# Patient Record
Sex: Female | Born: 1970 | Race: White | Hispanic: No | Marital: Married
Health system: Southern US, Community
[De-identification: ages and names within clinical notes are randomized; demographics above are authoritative.]

---

## 2014-02-12 ENCOUNTER — Other Ambulatory Visit (HOSPITAL_BASED_OUTPATIENT_CLINIC_OR_DEPARTMENT_OTHER): Payer: Self-pay | Admitting: Family Medicine

## 2014-02-12 ENCOUNTER — Other Ambulatory Visit: Payer: Self-pay | Admitting: Family Medicine

## 2014-02-12 ENCOUNTER — Other Ambulatory Visit (HOSPITAL_COMMUNITY)
Admission: RE | Admit: 2014-02-12 | Discharge: 2014-02-12 | Disposition: A | Discharge status: Home / Self Care | Payer: BC Managed Care – PPO | Source: Ambulatory Visit | Attending: Family Medicine | Admitting: Family Medicine

## 2014-02-12 DIAGNOSIS — Z0000 Encounter for general adult medical examination without abnormal findings: Secondary | ICD-10-CM | POA: Insufficient documentation

## 2014-02-12 DIAGNOSIS — Z1151 Encounter for screening for human papillomavirus (HPV): Secondary | ICD-10-CM | POA: Insufficient documentation

## 2014-02-12 DIAGNOSIS — Z1231 Encounter for screening mammogram for malignant neoplasm of breast: Secondary | ICD-10-CM

## 2014-02-16 ENCOUNTER — Ambulatory Visit (HOSPITAL_BASED_OUTPATIENT_CLINIC_OR_DEPARTMENT_OTHER)
Admission: RE | Admit: 2014-02-16 | Discharge: 2014-02-16 | Disposition: A | Discharge status: Home / Self Care | Payer: BC Managed Care – PPO | Source: Ambulatory Visit | Attending: Family Medicine | Admitting: Family Medicine

## 2014-02-16 DIAGNOSIS — Z1231 Encounter for screening mammogram for malignant neoplasm of breast: Secondary | ICD-10-CM | POA: Insufficient documentation

## 2017-03-01 DIAGNOSIS — Z0000 Encounter for general adult medical examination without abnormal findings: Secondary | ICD-10-CM | POA: Diagnosis not present

## 2017-03-01 DIAGNOSIS — E7800 Pure hypercholesterolemia, unspecified: Secondary | ICD-10-CM | POA: Diagnosis not present

## 2017-03-02 ENCOUNTER — Other Ambulatory Visit (HOSPITAL_BASED_OUTPATIENT_CLINIC_OR_DEPARTMENT_OTHER): Payer: Self-pay | Admitting: Family Medicine

## 2017-03-02 DIAGNOSIS — Z1231 Encounter for screening mammogram for malignant neoplasm of breast: Secondary | ICD-10-CM

## 2017-03-15 ENCOUNTER — Ambulatory Visit (HOSPITAL_BASED_OUTPATIENT_CLINIC_OR_DEPARTMENT_OTHER)
Admission: RE | Admit: 2017-03-15 | Discharge: 2017-03-15 | Disposition: A | Discharge status: Home / Self Care | Payer: 59 | Source: Ambulatory Visit | Attending: Family Medicine | Admitting: Family Medicine

## 2017-03-15 ENCOUNTER — Inpatient Hospital Stay (HOSPITAL_BASED_OUTPATIENT_CLINIC_OR_DEPARTMENT_OTHER): Admission: RE | Admit: 2017-03-15 | Payer: Self-pay | Source: Ambulatory Visit

## 2017-03-15 DIAGNOSIS — R928 Other abnormal and inconclusive findings on diagnostic imaging of breast: Secondary | ICD-10-CM | POA: Insufficient documentation

## 2017-03-15 DIAGNOSIS — Z1231 Encounter for screening mammogram for malignant neoplasm of breast: Secondary | ICD-10-CM | POA: Diagnosis not present

## 2017-03-16 ENCOUNTER — Other Ambulatory Visit: Payer: Self-pay | Admitting: Family Medicine

## 2017-03-16 DIAGNOSIS — R928 Other abnormal and inconclusive findings on diagnostic imaging of breast: Secondary | ICD-10-CM

## 2017-03-21 ENCOUNTER — Other Ambulatory Visit: Payer: 59

## 2017-03-22 ENCOUNTER — Other Ambulatory Visit: Payer: Self-pay | Admitting: Family Medicine

## 2017-03-22 DIAGNOSIS — N951 Menopausal and female climacteric states: Secondary | ICD-10-CM | POA: Diagnosis not present

## 2017-03-22 DIAGNOSIS — E041 Nontoxic single thyroid nodule: Secondary | ICD-10-CM

## 2017-03-23 ENCOUNTER — Ambulatory Visit
Admission: RE | Admit: 2017-03-23 | Discharge: 2017-03-23 | Disposition: A | Discharge status: Home / Self Care | Payer: 59 | Source: Ambulatory Visit | Attending: Family Medicine | Admitting: Family Medicine

## 2017-03-23 ENCOUNTER — Other Ambulatory Visit: Payer: Self-pay | Admitting: Family Medicine

## 2017-03-23 DIAGNOSIS — R928 Other abnormal and inconclusive findings on diagnostic imaging of breast: Secondary | ICD-10-CM

## 2017-03-23 DIAGNOSIS — N6489 Other specified disorders of breast: Secondary | ICD-10-CM | POA: Diagnosis not present

## 2017-03-30 ENCOUNTER — Ambulatory Visit
Admission: RE | Admit: 2017-03-30 | Discharge: 2017-03-30 | Disposition: A | Discharge status: Home / Self Care | Payer: 59 | Source: Ambulatory Visit | Attending: Family Medicine | Admitting: Family Medicine

## 2017-03-30 DIAGNOSIS — E042 Nontoxic multinodular goiter: Secondary | ICD-10-CM | POA: Diagnosis not present

## 2017-03-30 DIAGNOSIS — E041 Nontoxic single thyroid nodule: Secondary | ICD-10-CM

## 2017-04-06 ENCOUNTER — Other Ambulatory Visit: Payer: Self-pay | Admitting: Family Medicine

## 2017-04-06 DIAGNOSIS — E041 Nontoxic single thyroid nodule: Secondary | ICD-10-CM

## 2017-04-10 ENCOUNTER — Ambulatory Visit
Admission: RE | Admit: 2017-04-10 | Discharge: 2017-04-10 | Disposition: A | Discharge status: Home / Self Care | Payer: 59 | Source: Ambulatory Visit | Attending: Family Medicine | Admitting: Family Medicine

## 2017-04-10 ENCOUNTER — Other Ambulatory Visit (HOSPITAL_COMMUNITY)
Admission: RE | Admit: 2017-04-10 | Discharge: 2017-04-10 | Disposition: A | Discharge status: Home / Self Care | Payer: 59 | Source: Ambulatory Visit | Attending: Radiology | Admitting: Radiology

## 2017-04-10 DIAGNOSIS — E041 Nontoxic single thyroid nodule: Secondary | ICD-10-CM | POA: Diagnosis present

## 2017-05-09 DIAGNOSIS — E041 Nontoxic single thyroid nodule: Secondary | ICD-10-CM | POA: Diagnosis not present

## 2017-08-03 DIAGNOSIS — N200 Calculus of kidney: Secondary | ICD-10-CM | POA: Diagnosis not present

## 2017-08-03 DIAGNOSIS — R319 Hematuria, unspecified: Secondary | ICD-10-CM | POA: Diagnosis not present

## 2017-08-03 DIAGNOSIS — N132 Hydronephrosis with renal and ureteral calculous obstruction: Secondary | ICD-10-CM | POA: Diagnosis not present

## 2017-08-08 DIAGNOSIS — N390 Urinary tract infection, site not specified: Secondary | ICD-10-CM | POA: Diagnosis not present

## 2017-08-10 DIAGNOSIS — Z01818 Encounter for other preprocedural examination: Secondary | ICD-10-CM | POA: Diagnosis not present

## 2017-08-10 DIAGNOSIS — N200 Calculus of kidney: Secondary | ICD-10-CM | POA: Diagnosis not present

## 2017-08-10 DIAGNOSIS — R109 Unspecified abdominal pain: Secondary | ICD-10-CM | POA: Diagnosis not present

## 2017-08-10 DIAGNOSIS — N201 Calculus of ureter: Secondary | ICD-10-CM | POA: Diagnosis not present

## 2017-08-11 DIAGNOSIS — N132 Hydronephrosis with renal and ureteral calculous obstruction: Secondary | ICD-10-CM | POA: Diagnosis not present

## 2017-08-11 DIAGNOSIS — R5081 Fever presenting with conditions classified elsewhere: Secondary | ICD-10-CM | POA: Diagnosis not present

## 2017-08-11 DIAGNOSIS — N201 Calculus of ureter: Secondary | ICD-10-CM | POA: Diagnosis not present

## 2017-08-15 DIAGNOSIS — N200 Calculus of kidney: Secondary | ICD-10-CM | POA: Diagnosis not present

## 2017-10-25 ENCOUNTER — Other Ambulatory Visit: Payer: Self-pay | Admitting: Family Medicine

## 2017-10-25 DIAGNOSIS — N63 Unspecified lump in unspecified breast: Secondary | ICD-10-CM

## 2017-11-06 ENCOUNTER — Other Ambulatory Visit: Payer: Self-pay | Admitting: Family Medicine

## 2017-11-06 ENCOUNTER — Ambulatory Visit
Admission: RE | Admit: 2017-11-06 | Discharge: 2017-11-06 | Disposition: A | Discharge status: Home / Self Care | Payer: 59 | Source: Ambulatory Visit | Attending: Family Medicine | Admitting: Family Medicine

## 2017-11-06 DIAGNOSIS — N63 Unspecified lump in unspecified breast: Secondary | ICD-10-CM

## 2017-11-06 DIAGNOSIS — R928 Other abnormal and inconclusive findings on diagnostic imaging of breast: Secondary | ICD-10-CM | POA: Diagnosis not present

## 2017-11-06 DIAGNOSIS — N6320 Unspecified lump in the left breast, unspecified quadrant: Secondary | ICD-10-CM

## 2018-03-11 DIAGNOSIS — B9689 Other specified bacterial agents as the cause of diseases classified elsewhere: Secondary | ICD-10-CM | POA: Diagnosis not present

## 2018-03-11 DIAGNOSIS — Q615 Medullary cystic kidney: Secondary | ICD-10-CM | POA: Diagnosis not present

## 2018-03-11 DIAGNOSIS — E7800 Pure hypercholesterolemia, unspecified: Secondary | ICD-10-CM | POA: Diagnosis not present

## 2018-03-11 DIAGNOSIS — E041 Nontoxic single thyroid nodule: Secondary | ICD-10-CM | POA: Diagnosis not present

## 2018-03-11 DIAGNOSIS — J0100 Acute maxillary sinusitis, unspecified: Secondary | ICD-10-CM | POA: Diagnosis not present

## 2018-03-11 DIAGNOSIS — N760 Acute vaginitis: Secondary | ICD-10-CM | POA: Diagnosis not present

## 2018-03-11 DIAGNOSIS — Z0001 Encounter for general adult medical examination with abnormal findings: Secondary | ICD-10-CM | POA: Diagnosis not present

## 2018-04-01 ENCOUNTER — Other Ambulatory Visit: Payer: Self-pay | Admitting: Family Medicine

## 2018-04-01 DIAGNOSIS — E041 Nontoxic single thyroid nodule: Secondary | ICD-10-CM

## 2018-04-04 DIAGNOSIS — N951 Menopausal and female climacteric states: Secondary | ICD-10-CM | POA: Diagnosis not present

## 2018-04-04 DIAGNOSIS — R635 Abnormal weight gain: Secondary | ICD-10-CM | POA: Diagnosis not present

## 2018-04-05 ENCOUNTER — Ambulatory Visit
Admission: RE | Admit: 2018-04-05 | Discharge: 2018-04-05 | Disposition: A | Discharge status: Home / Self Care | Payer: 59 | Source: Ambulatory Visit | Attending: Family Medicine | Admitting: Family Medicine

## 2018-04-05 DIAGNOSIS — E041 Nontoxic single thyroid nodule: Secondary | ICD-10-CM | POA: Diagnosis not present

## 2018-04-08 DIAGNOSIS — R232 Flushing: Secondary | ICD-10-CM | POA: Diagnosis not present

## 2018-04-08 DIAGNOSIS — E782 Mixed hyperlipidemia: Secondary | ICD-10-CM | POA: Diagnosis not present

## 2018-04-08 DIAGNOSIS — E559 Vitamin D deficiency, unspecified: Secondary | ICD-10-CM | POA: Diagnosis not present

## 2018-04-09 DIAGNOSIS — E782 Mixed hyperlipidemia: Secondary | ICD-10-CM | POA: Diagnosis not present

## 2018-04-18 DIAGNOSIS — N951 Menopausal and female climacteric states: Secondary | ICD-10-CM | POA: Diagnosis not present

## 2018-04-18 DIAGNOSIS — E782 Mixed hyperlipidemia: Secondary | ICD-10-CM | POA: Diagnosis not present

## 2018-04-18 DIAGNOSIS — R232 Flushing: Secondary | ICD-10-CM | POA: Diagnosis not present

## 2018-04-25 DIAGNOSIS — E782 Mixed hyperlipidemia: Secondary | ICD-10-CM | POA: Diagnosis not present

## 2018-04-30 ENCOUNTER — Ambulatory Visit
Admission: RE | Admit: 2018-04-30 | Discharge: 2018-04-30 | Disposition: A | Discharge status: Home / Self Care | Payer: 59 | Source: Ambulatory Visit | Attending: Family Medicine | Admitting: Family Medicine

## 2018-04-30 DIAGNOSIS — N6320 Unspecified lump in the left breast, unspecified quadrant: Secondary | ICD-10-CM

## 2018-04-30 DIAGNOSIS — N6321 Unspecified lump in the left breast, upper outer quadrant: Secondary | ICD-10-CM | POA: Diagnosis not present

## 2018-04-30 DIAGNOSIS — R922 Inconclusive mammogram: Secondary | ICD-10-CM | POA: Diagnosis not present

## 2018-05-02 DIAGNOSIS — E782 Mixed hyperlipidemia: Secondary | ICD-10-CM | POA: Diagnosis not present

## 2018-05-02 DIAGNOSIS — E559 Vitamin D deficiency, unspecified: Secondary | ICD-10-CM | POA: Diagnosis not present

## 2018-05-08 DIAGNOSIS — E782 Mixed hyperlipidemia: Secondary | ICD-10-CM | POA: Diagnosis not present

## 2018-05-08 DIAGNOSIS — E559 Vitamin D deficiency, unspecified: Secondary | ICD-10-CM | POA: Diagnosis not present

## 2018-05-08 DIAGNOSIS — E663 Overweight: Secondary | ICD-10-CM | POA: Diagnosis not present

## 2018-05-16 DIAGNOSIS — R232 Flushing: Secondary | ICD-10-CM | POA: Diagnosis not present

## 2018-05-16 DIAGNOSIS — Z72820 Sleep deprivation: Secondary | ICD-10-CM | POA: Diagnosis not present

## 2018-05-16 DIAGNOSIS — N951 Menopausal and female climacteric states: Secondary | ICD-10-CM | POA: Diagnosis not present

## 2018-05-16 DIAGNOSIS — E782 Mixed hyperlipidemia: Secondary | ICD-10-CM | POA: Diagnosis not present

## 2018-05-23 DIAGNOSIS — E663 Overweight: Secondary | ICD-10-CM | POA: Diagnosis not present

## 2018-05-23 DIAGNOSIS — R6882 Decreased libido: Secondary | ICD-10-CM | POA: Diagnosis not present

## 2018-05-23 DIAGNOSIS — E782 Mixed hyperlipidemia: Secondary | ICD-10-CM | POA: Diagnosis not present

## 2018-06-06 DIAGNOSIS — E663 Overweight: Secondary | ICD-10-CM | POA: Diagnosis not present

## 2018-06-20 DIAGNOSIS — E669 Obesity, unspecified: Secondary | ICD-10-CM | POA: Diagnosis not present

## 2018-06-20 DIAGNOSIS — E782 Mixed hyperlipidemia: Secondary | ICD-10-CM | POA: Diagnosis not present

## 2018-06-20 DIAGNOSIS — E041 Nontoxic single thyroid nodule: Secondary | ICD-10-CM | POA: Diagnosis not present

## 2018-06-20 DIAGNOSIS — E559 Vitamin D deficiency, unspecified: Secondary | ICD-10-CM | POA: Diagnosis not present

## 2018-06-25 DIAGNOSIS — E8881 Metabolic syndrome: Secondary | ICD-10-CM | POA: Diagnosis not present

## 2018-06-25 DIAGNOSIS — E782 Mixed hyperlipidemia: Secondary | ICD-10-CM | POA: Diagnosis not present

## 2018-06-25 DIAGNOSIS — E559 Vitamin D deficiency, unspecified: Secondary | ICD-10-CM | POA: Diagnosis not present

## 2018-08-08 DIAGNOSIS — N6001 Solitary cyst of right breast: Secondary | ICD-10-CM | POA: Diagnosis not present

## 2018-08-22 DIAGNOSIS — R232 Flushing: Secondary | ICD-10-CM | POA: Diagnosis not present

## 2018-08-22 DIAGNOSIS — Z72820 Sleep deprivation: Secondary | ICD-10-CM | POA: Diagnosis not present

## 2018-08-22 DIAGNOSIS — E041 Nontoxic single thyroid nodule: Secondary | ICD-10-CM | POA: Diagnosis not present

## 2018-08-27 DIAGNOSIS — R232 Flushing: Secondary | ICD-10-CM | POA: Diagnosis not present

## 2018-08-27 DIAGNOSIS — R6882 Decreased libido: Secondary | ICD-10-CM | POA: Diagnosis not present

## 2018-08-27 DIAGNOSIS — N951 Menopausal and female climacteric states: Secondary | ICD-10-CM | POA: Diagnosis not present

## 2018-11-02 IMAGING — MG DIGITAL DIAGNOSTIC BILATERAL MAMMOGRAM WITH TOMO AND CAD
8 series · 8 of 24 positions shown · non-contrast
Comparison: Previous exam(s).

CLINICAL DATA: Patient for short-term follow-up probably benign
left breast mass.

EXAM:
DIGITAL DIAGNOSTIC BILATERAL MAMMOGRAM WITH CAD AND TOMO
ULTRASOUND LEFT BREAST

[L CC synth-2D]
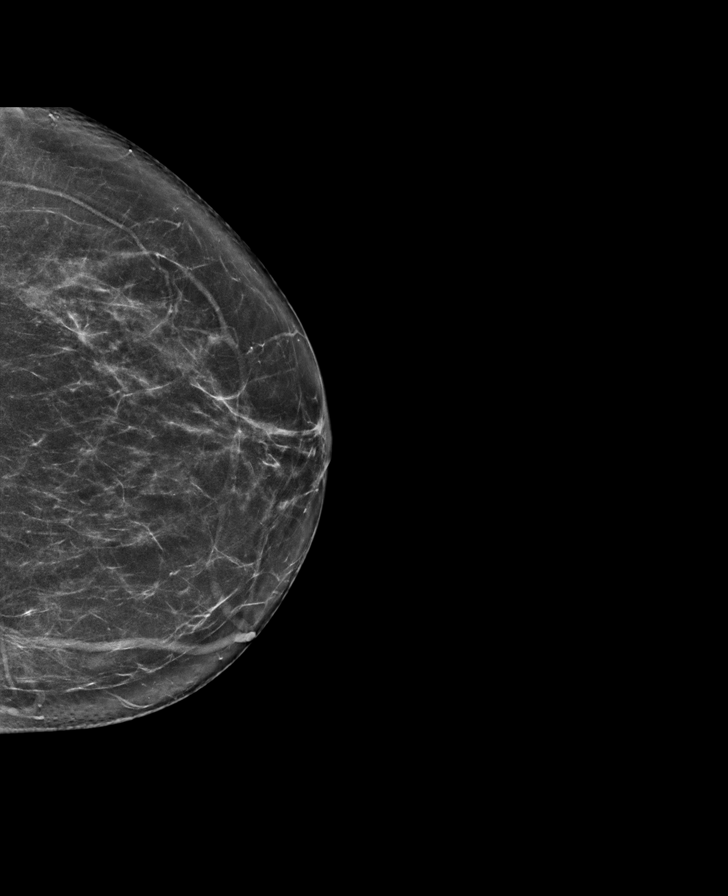

[L MLO synth-2D]
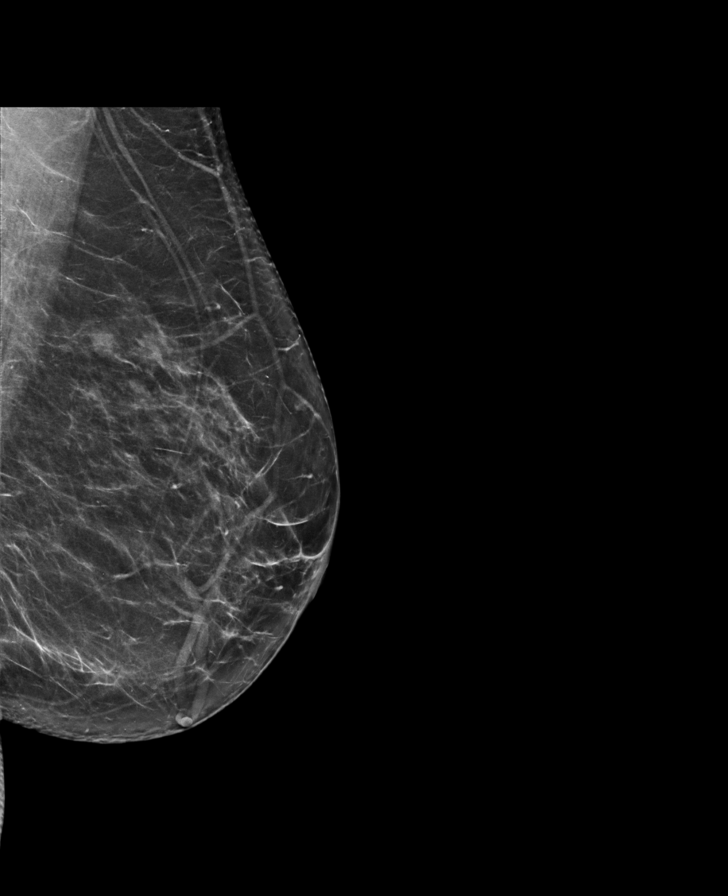

[R MLO synth-2D]
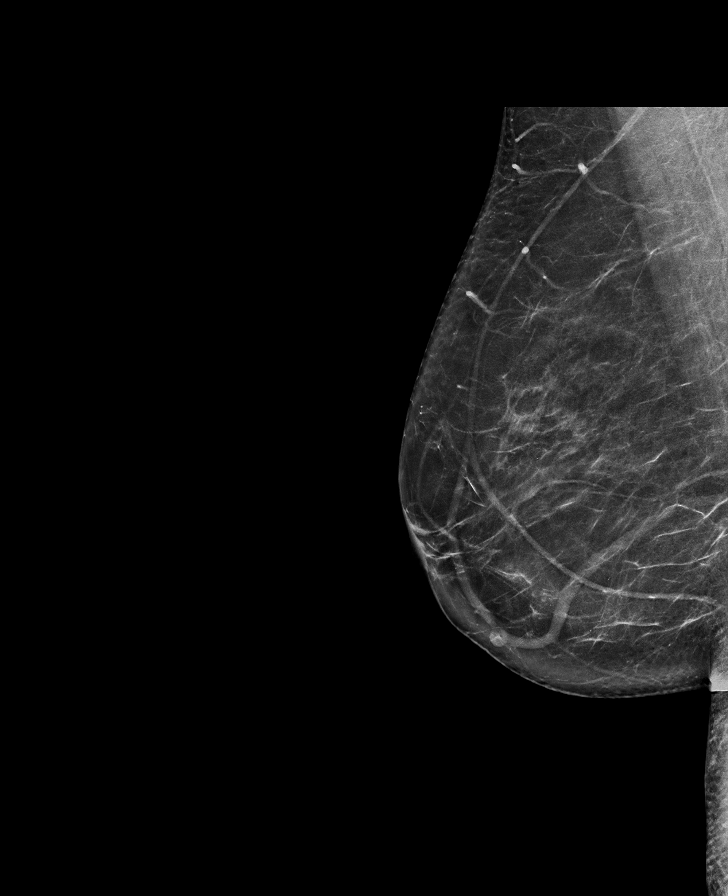

[R CC synth-2D]
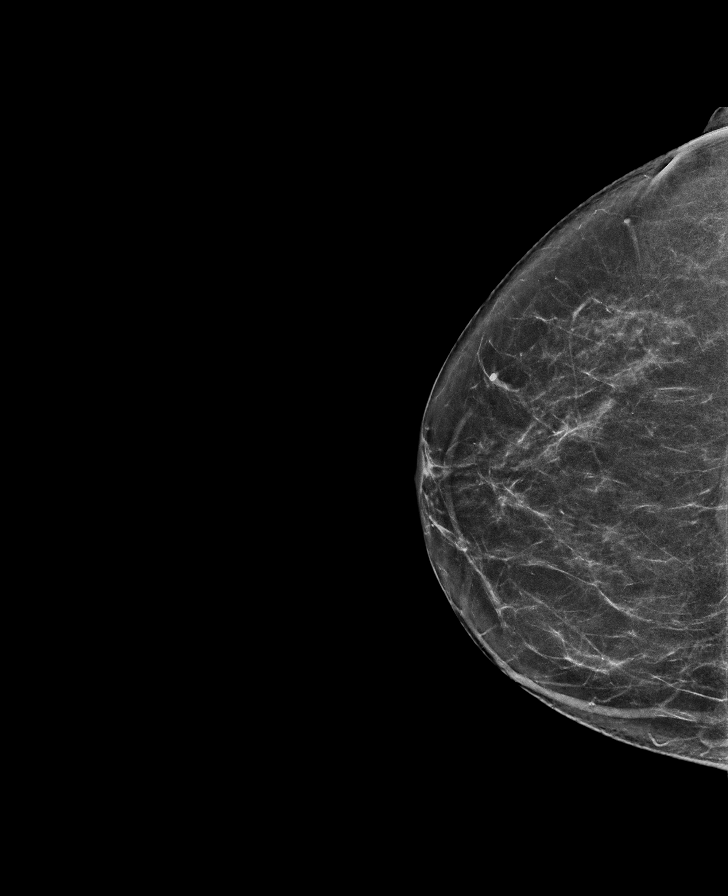

[L MLO tomo · tomo slice 38/75.0]
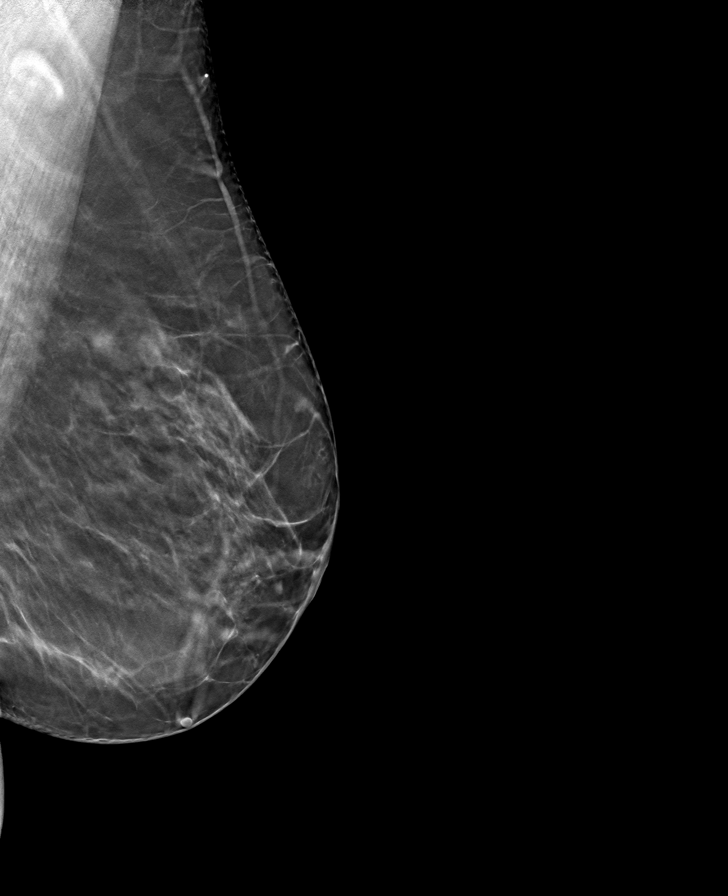

[L CC tomo · tomo slice 36/71.0]
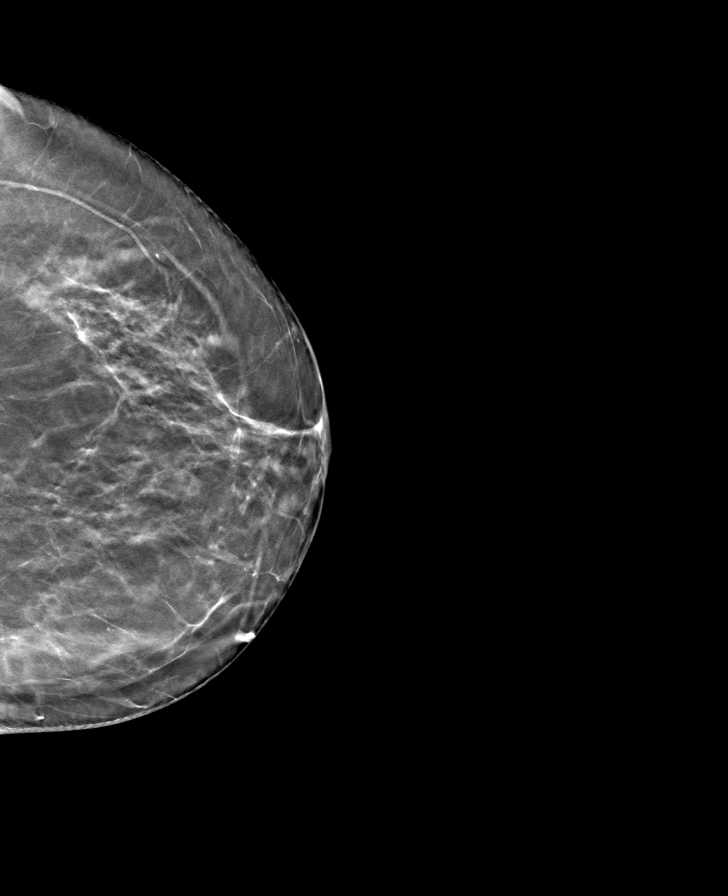

[R CC tomo · tomo slice 34/67.0]
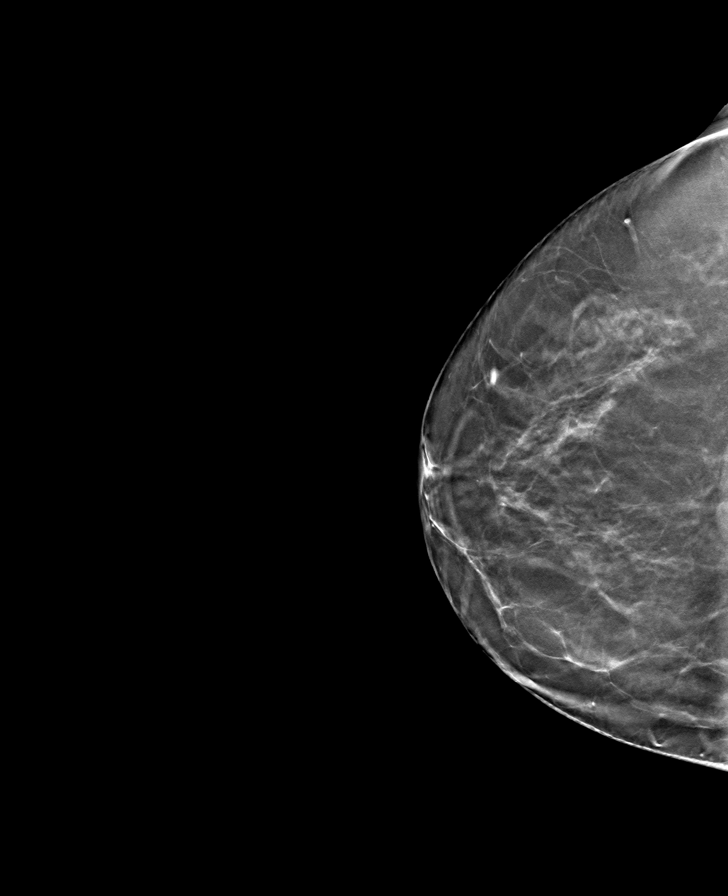

[R MLO tomo · tomo slice 39/77.0]
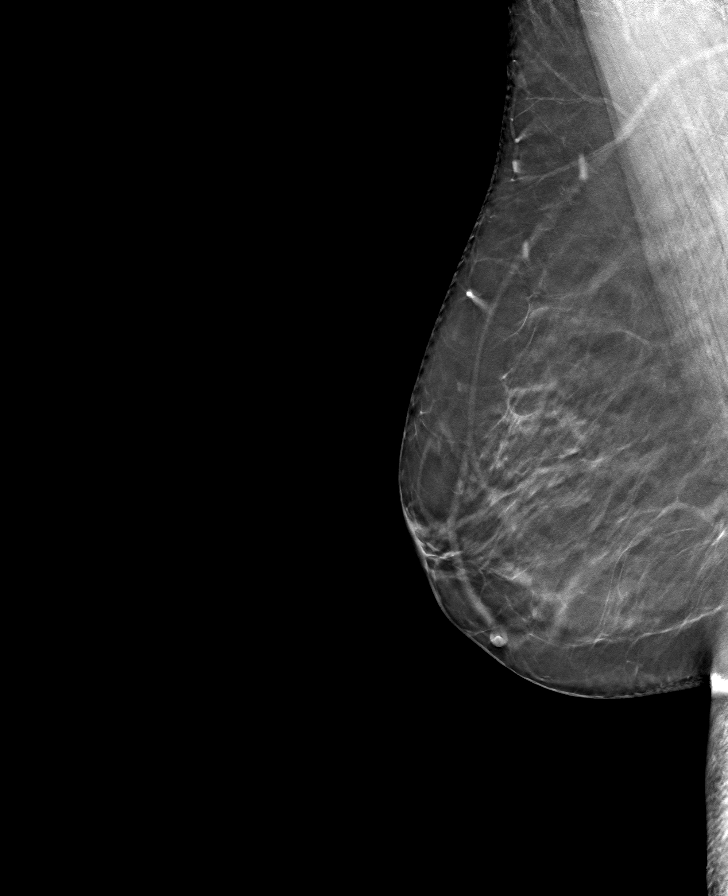

[8 of 24 positions shown; findings below may reference images not displayed]

ACR Breast Density Category b: There are scattered areas of
fibroglandular density.
FINDINGS: Grossly unchanged oval circumscribed mass within the lateral left
breast. No additional concerning masses, calcifications or
distortion identified within either breast.

Mammographic images were processed with CAD.

Targeted ultrasound is performed, showing a 4 x 8 x 7 mm oval
circumscribed hypoechoic mass left breast 2 o'clock position 7 cm
from nipple, previously 7 x 4 x 7 mm.
IMPRESSION: Stable probably benign left breast mass.

RECOMMENDATION:
Bilateral diagnostic mammography and left breast ultrasound in 12
months.

I have discussed the findings and recommendations with the patient.
Results were also provided in writing at the conclusion of the
visit. If applicable, a reminder letter will be sent to the patient
regarding the next appointment.

BI-RADS CATEGORY  3: Probably benign.

## 2018-11-12 DIAGNOSIS — R200 Anesthesia of skin: Secondary | ICD-10-CM | POA: Diagnosis not present

## 2018-11-12 DIAGNOSIS — M75101 Unspecified rotator cuff tear or rupture of right shoulder, not specified as traumatic: Secondary | ICD-10-CM | POA: Diagnosis not present

## 2018-12-09 ENCOUNTER — Ambulatory Visit: Payer: 59 | Attending: Family Medicine | Admitting: Physical Therapy

## 2018-12-09 ENCOUNTER — Other Ambulatory Visit: Payer: Self-pay

## 2018-12-09 ENCOUNTER — Encounter: Payer: Self-pay | Admitting: Physical Therapy

## 2018-12-09 DIAGNOSIS — M25511 Pain in right shoulder: Secondary | ICD-10-CM | POA: Diagnosis not present

## 2018-12-09 DIAGNOSIS — M25611 Stiffness of right shoulder, not elsewhere classified: Secondary | ICD-10-CM | POA: Diagnosis present

## 2018-12-09 NOTE — Patient Instructions (Signed)
Access Code: [email or web address]  URL: [email or web address]  Date: 12/09/2018  Prepared by: Stacie GlazeMichael Darly Massi   Exercises  Shoulder extension with resistance - Neutral - 10 reps - 3 sets - 3 hold - 1x daily - 7x weekly  Standing Shoulder External Rotation with Resistance - 10 reps - 3 sets - 3 hold - 1x daily - 7x weekly  Squatting Shoulder Row with Anchored Resistance - 10 reps - 3 sets - 3 hold - 1x daily - 7x weekly  Seated Scapular Retraction - 10 reps - 1 sets - 2 hold - 1x daily - 7x weekly  Seated Shoulder Shrugs - 10 reps - 3 sets - 2 hold - 1x daily - 7x weekly  Doorway Pec Stretch at 90 Degrees Abduction - 5 reps - 2 sets - 10 hold - 1x daily - 7x weekly

## 2018-12-09 NOTE — Therapy (Signed)
[facility] [address] [place], [place], [address] Phone: [phone]   Fax:  [phone]  Physical Therapy Evaluation  Patient Details  Name: Stacey Bradley MRN: [id number] Date of Birth: 02/26/1971 Referring Provider (PT): Gweneth DimitriWendy McNeill   Encounter Date: 12/09/2018  PT End of Session - 12/09/18 0925    Visit Number  1    Date for PT Re-Evaluation  02/09/19    PT Start Time  0845    PT Stop Time  0930    PT Time Calculation (min)  45 min    Activity Tolerance  Patient tolerated treatment well    Behavior During Therapy  [facility] for tasks assessed/performed       History reviewed. No pertinent past medical history.  History reviewed. No pertinent surgical history.  There were no vitals filed for this visit.   Subjective Assessment - 12/09/18 0847    Subjective  Patient reports that she has been having right shoulder pain in September/October.  She reports that she started working out with a Psychologist, educationaltrainer in September.  She is right handed.  No imaging has been performed.      Limitations  Lifting;House hold activities    Patient Stated Goals  have less pain, sleep on right side    Currently in Pain?  Yes    Pain Score  2     Pain Location  Shoulder    Pain Orientation  Right;Anterior;Lateral    Pain Descriptors / Indicators  Aching;Throbbing;Tightness    Pain Type  Acute pain    Pain Radiating Towards  denies    Pain Onset  More than a month ago    Pain Frequency  Constant    Aggravating Factors   reashing out to side, lying on the right side    Pain Relieving Factors  avoid using helps, pain can be 1/10    Effect of Pain on Daily Activities  difficulty sleeping, reaching, doing hair, difficulty taking bra off, lifting         [facility] PT Assessment - 12/09/18 0001      Assessment   Medical Diagnosis  right shoulder pain    Referring Provider (PT)  Gweneth DimitriWendy McNeill    Onset Date/Surgical Date  11/09/18    Hand  Dominance  Right      Precautions   Precautions  None      Balance Screen   Has the patient fallen in the past 6 months  No    Has the patient had a decrease in activity level because of a fear of falling?   No    Is the patient reluctant to leave their home because of a fear of falling?   No      Home Environment   Additional Comments  does housework,       Prior Function   Level of Independence  Independent    Vocation  Full time employment    Industrial/product designerVocation Requirements  nurse, mostly sitting    Leisure  works out some 2x/week      Posture/Postural Control   Posture Comments  mild rounded shoulders      ROM / Strength   AROM / PROM / Strength  AROM;Strength;PROM      AROM   Overall AROM Comments  all motions increase the pain    AROM Assessment Site  Shoulder    Right/Left Shoulder  Right    Right Shoulder Flexion  140 Degrees  Right Shoulder ABduction  145 Degrees    Right Shoulder Internal Rotation  48 Degrees    Right Shoulder External Rotation  90 Degrees      PROM   Overall PROM Comments  WNL's with pain      Strength   Overall Strength Comments  right shoulder 4-/5 with pain for all motions      Palpation   Palpation comment  very tender in the right biceps origin, she is mildly tender in the lateral right shoulder, she is very tight in the upper trap mms      Special Tests   Other special tests  has a positive impingement and mild pain with empty can test                Objective measurements completed on examination: See above findings.      [facility] Adult PT Treatment/Exercise - 12/09/18 0001      Modalities   Modalities  Iontophoresis      Iontophoresis   Type of Iontophoresis  Dexamethasone    Location  right biceps origin    Dose  80mA    Time  4 hour patch               PT Short Term Goals - 12/09/18 0929      PT SHORT TERM GOAL #1   Title  independent with initial HEP    Time  2    Period  Weeks    Status  New         PT Long Term Goals - 12/09/18 2952      PT LONG TERM GOAL #1   Title  understand posture and body mechanics    Time  8    Period  Weeks    Status  New      PT LONG TERM GOAL #2   Title  decrease pain 50%    Time  8    Period  Weeks    Status  New      PT LONG TERM GOAL #3   Title  increase IR ROM to WNL's    Time  8    Period  Weeks    Status  New             Plan - 12/09/18 8413    Clinical Impression Statement  Patient reports that she starting having right shoulder pain after starting working out in the gym with a trainer.  She is right handed.  She is very tender in the right biceps origin and the lateral shoulder, + impingement sign.  Mildly rounded shoulders.  Limitation in flexion abduction and IR with pain.  Pain with reaching out to side and up in front    Clinical Presentation  Stable    Clinical Decision Making  Low    Rehab Potential  Good    PT Frequency  2x / week    PT Duration  8 weeks    PT Treatment/Interventions  ADLs/Self Care Home Management;Cryotherapy;Electrical Stimulation;Iontophoresis 4mg /ml Dexamethasone;Moist Heat;Ultrasound;Therapeutic exercise;Therapeutic activities;Neuromuscular re-education;Patient/family education;Manual techniques;Dry needling    PT Next Visit Plan  Patient has a high deductible, may hold treatment, needs scapular stabvilization    Consulted and Agree with Plan of Care  Patient       Patient will benefit from skilled therapeutic intervention in order to improve the following deficits and impairments:  Improper body mechanics, Pain, Decreased range of motion, Decreased strength, Impaired UE functional use, Impaired flexibility  Visit Diagnosis: Acute pain of right shoulder - Plan: PT plan of care cert/re-cert  Stiffness of right shoulder, not elsewhere classified - Plan: PT plan of care cert/re-cert     Problem List There are no active problems to display for this patient.   Jearld LeschALBRIGHT,Junette Bernat W.,  PT 12/09/2018, 9:32 AM  [facility] [address] [place], [place], [address] Phone: [phone]   Fax:  [phone]  Name: Stacey Bradley MRN: [id number] Date of Birth: 1971-11-28

## 2018-12-23 ENCOUNTER — Encounter: Payer: Self-pay | Admitting: Physical Therapy

## 2018-12-23 ENCOUNTER — Ambulatory Visit: Payer: 59 | Attending: Family Medicine | Admitting: Physical Therapy

## 2018-12-23 DIAGNOSIS — M25511 Pain in right shoulder: Secondary | ICD-10-CM | POA: Diagnosis not present

## 2018-12-23 DIAGNOSIS — M25611 Stiffness of right shoulder, not elsewhere classified: Secondary | ICD-10-CM | POA: Diagnosis present

## 2018-12-23 NOTE — Therapy (Signed)
[facility] [address] [place], [place], [address] Phone: [phone]   Fax:  [phone]  Physical Therapy Treatment  Patient Details  Name: Stacey Bradley MRN: [id number] Date of Birth: 11/19/1971 Referring Provider (PT): Gweneth Dimitri   Encounter Date: 12/23/2018  PT End of Session - 12/23/18 0926    Visit Number  2    Date for PT Re-Evaluation  02/09/19    PT Start Time  0756    PT Stop Time  0842    PT Time Calculation (min)  46 min    Activity Tolerance  Patient tolerated treatment well    Behavior During Therapy  [facility] for tasks assessed/performed       History reviewed. No pertinent past medical history.  History reviewed. No pertinent surgical history.  There were no vitals filed for this visit.  Subjective Assessment - 12/23/18 0803    Subjective  Patient reports not much change, felt better sleeping last night    Currently in Pain?  Yes    Pain Score  2     Pain Location  Shoulder    Pain Orientation  Right;Anterior;Lateral    Aggravating Factors   reaching out, and behind                       [facility] Adult PT Treatment/Exercise - 12/23/18 0001      Exercises   Exercises  Shoulder      Shoulder Exercises: Seated   Other Seated Exercises  bent over row 5#, benot over extension 3#, reverse flies 2#      Shoulder Exercises: ROM/Strengthening   UBE (Upper Arm Bike)  level 4 x 5 minutes    Lat Pull  2.5 plate;20 reps    Cybex Row  2.5 plate;20 reps    "W" Arms  20    Other ROM/Strengthening Exercises  red tband ER, weighted ball overhead rhythmic stabilization      Shoulder Exercises: Stretch   Corner Stretch  3 reps;10 seconds      Modalities   Modalities  Iontophoresis      Iontophoresis   Type of Iontophoresis  Dexamethasone    Location  right biceps origin    Dose  87mA    Time  4 hour patch      Manual Therapy   Manual Therapy  Passive ROM    Passive ROM  passive  ROM all GH motions, some gentle GH joint mobs               PT Short Term Goals - 12/23/18 0927      PT SHORT TERM GOAL #1   Title  independent with initial HEP    Status  Achieved        PT Long Term Goals - 12/09/18 0929      PT LONG TERM GOAL #1   Title  understand posture and body mechanics    Time  8    Period  Weeks    Status  New      PT LONG TERM GOAL #2   Title  decrease pain 50%    Time  8    Period  Weeks    Status  New      PT LONG TERM GOAL #3   Title  increase IR ROM to WNL's    Time  8    Period  Weeks    Status  New  Plan - 12/23/18 0926    Clinical Impression Statement  Patient demonstrates increased ROM ofthe right shoulder for IR, she is still with pain in the biceps and lateral shoulder with certain motions, did well with the exercises today, very tight with neural tension in the right UE    PT Next Visit Plan  asked patient to continue as she is doing, again high deductible and seeing sporadically    Consulted and Agree with Plan of Care  Patient       Patient will benefit from skilled therapeutic intervention in order to improve the following deficits and impairments:  Improper body mechanics, Pain, Decreased range of motion, Decreased strength, Impaired UE functional use, Impaired flexibility  Visit Diagnosis: Acute pain of right shoulder  Stiffness of right shoulder, not elsewhere classified     Problem List There are no active problems to display for this patient.   Jearld Lesch., PT 12/23/2018, 9:33 AM  [facility] [address] [place], [place], [address] Phone: [phone]   Fax:  [phone]  Name: IMYA SIROTA MRN: [id number] Date of Birth: 05/01/1971

## 2019-01-06 ENCOUNTER — Ambulatory Visit: Payer: 59 | Admitting: Physical Therapy

## 2019-01-06 ENCOUNTER — Encounter: Payer: Self-pay | Admitting: Physical Therapy

## 2019-01-06 DIAGNOSIS — M25511 Pain in right shoulder: Secondary | ICD-10-CM

## 2019-01-06 DIAGNOSIS — M25611 Stiffness of right shoulder, not elsewhere classified: Secondary | ICD-10-CM

## 2019-01-06 NOTE — Therapy (Signed)
[place], [place], [address] Phone: [phone]   Fax:  [phone]  Physical Therapy Treatment  Patient Details  Name: Stacey Bradley MRN: [id number] Date of Birth: 1971/09/11 Referring Provider (PT): Cari Caraway   Encounter Date: 01/06/2019  PT End of Session - 01/06/19 0921    Visit Number  3    Date for PT Re-Evaluation  02/09/19    PT Start Time  0756    PT Stop Time  0844    PT Time Calculation (min)  48 min    Activity Tolerance  Patient tolerated treatment well    Behavior During Therapy  [facility] for tasks assessed/performed       History reviewed. No pertinent past medical history.  History reviewed. No pertinent surgical history.  There were no vitals filed for this visit.  Subjective Assessment - 01/06/19 0758    Subjective  Patient reports that she feels like she is a little better overall, sleeping better    Currently in Pain?  Yes    Pain Score  2     Pain Location  Shoulder    Pain Orientation  Right;Lateral;Anterior    Pain Descriptors / Indicators  Aching    Aggravating Factors   pressure on arm, trying to plank         [facility] PT Assessment - 01/06/19 0001      AROM   Right Shoulder Flexion  150 Degrees    Right Shoulder ABduction  150 Degrees    Right Shoulder Internal Rotation  61 Degrees    Right Shoulder External Rotation  90 Degrees                   [facility] Adult PT Treatment/Exercise - 01/06/19 0001      Shoulder Exercises: ROM/Strengthening   UBE (Upper Arm Bike)  level 4 x 5 minutes    Lat Pull  2.5 plate;20 reps    Cybex Row  2.5 plate;20 reps    "W" Arms  20 PT overpressure to get better ROM    Other ROM/Strengthening Exercises  green tband ER, weighted ball overhead rhythmic stabilization      Shoulder Exercises: Stretch   Corner Stretch  3 reps;10 seconds      Iontophoresis   Type of Iontophoresis  Dexamethasone    Location  right  biceps origin    Dose  34m    Time  4 hour patch      Manual Therapy   Manual Therapy  Joint mobilization;Passive ROM    Joint Mobilization  all GH joint mobs grade III    Passive ROM  PROM come contract relax at end ranges             PT Education - 01/06/19 0921    Education Details  gave GNyoka Cowdentband to do at home with current HEP    Person(s) Educated  Patient    Comprehension  Verbalized understanding       PT Short Term Goals - 12/23/18 0927      PT SHORT TERM GOAL #1   Title  independent with initial HEP    Status  Achieved        PT Long Term Goals - 01/06/19 1010      PT LONG TERM GOAL #1   Title  understand posture and body mechanics    Status  Partially Met      PT LONG  TERM GOAL #2   Title  decrease pain 50%    Status  Partially Met      PT LONG TERM GOAL #3   Title  increase IR ROM to WNL's    Status  Partially Met            Plan - 01/06/19 1009    Clinical Impression Statement  Patient with much improved IR, she also reports that she is sleeping better.  She is sore int he anterior and lateral shoulder with much of the motions, joint mobs were good without much pain, she does need cues for scapular stability with exercises.    PT Next Visit Plan  see how she is doing and look at what she is or was doing at the gym    Consulted and Agree with Plan of Care  Patient       Patient will benefit from skilled therapeutic intervention in order to improve the following deficits and impairments:  Improper body mechanics, Pain, Decreased range of motion, Decreased strength, Impaired UE functional use, Impaired flexibility  Visit Diagnosis: Acute pain of right shoulder  Stiffness of right shoulder, not elsewhere classified     Problem List There are no active problems to display for this patient.   Sumner Boast., PT 01/06/2019, 10:11 AM  [place]  [place], [place], [address] Phone: [phone]   Fax:  [phone]  Name: Stacey Bradley MRN: [id number] Date of Birth: [birth year]
